# Patient Record
Sex: Male | Born: 1950
Health system: Southern US, Community
[De-identification: ages and names within clinical notes are randomized; demographics above are authoritative.]

## PROBLEM LIST (undated history)

## (undated) DIAGNOSIS — I6529 Occlusion and stenosis of unspecified carotid artery: Secondary | ICD-10-CM

## (undated) DIAGNOSIS — E119 Type 2 diabetes mellitus without complications: Secondary | ICD-10-CM

## (undated) DIAGNOSIS — Z789 Other specified health status: Secondary | ICD-10-CM

## (undated) DIAGNOSIS — H3320 Serous retinal detachment, unspecified eye: Secondary | ICD-10-CM

## (undated) DIAGNOSIS — L409 Psoriasis, unspecified: Secondary | ICD-10-CM

## (undated) HISTORY — PX: RETINAL DETACHMENT REPAIR W/ SCLERAL BUCKLE LE: SHX2338

## (undated) HISTORY — DX: Type 2 diabetes mellitus without complications: E11.9

## (undated) HISTORY — DX: Psoriasis, unspecified: L40.9

## (undated) HISTORY — DX: Serous retinal detachment, unspecified eye: H33.20

## (undated) HISTORY — DX: Other specified health status: Z78.9

## (undated) HISTORY — DX: Occlusion and stenosis of unspecified carotid artery: I65.29

---

## 2003-09-12 HISTORY — PX: CAROTID ENDARTERECTOMY: SUR193

## 2005-04-21 ENCOUNTER — Encounter: Admission: RE | Admit: 2005-04-21 | Discharge: 2005-04-21 | Payer: Self-pay | Admitting: *Deleted

## 2005-05-02 ENCOUNTER — Inpatient Hospital Stay (HOSPITAL_COMMUNITY): Admission: RE | Admit: 2005-05-02 | Discharge: 2005-05-03 | Payer: Self-pay | Admitting: *Deleted

## 2006-04-20 IMAGING — CT CT CHEST W/ CM
1 series · 15 of 31 positions shown, 19 images · IV contrast (omnipaque)
Comparison: 04/28/05

CLINICAL DATA: 53 year-old male with carotid stenosis, status post endarterectomy.  Right lower lobe nodule visible on 04/28/05 chest x-ray.
CHEST CT WITH CONTRAST:
TECHNIQUE: Multidetector CT imaging of the chest was performed following the standard protocol during bolus administration of intravenous contrast.
Contrast:  100 cc Omnipaque 300

[Series 2: routine chest · axial · 0.78mm/px · z∈[-327,-47]mm · 15 of 62 slices shown, 19 images]
[im 3/62  mediastinal]
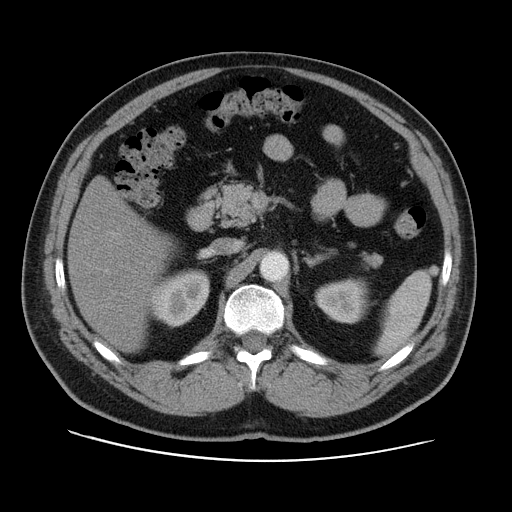
[im 3/62  lung]
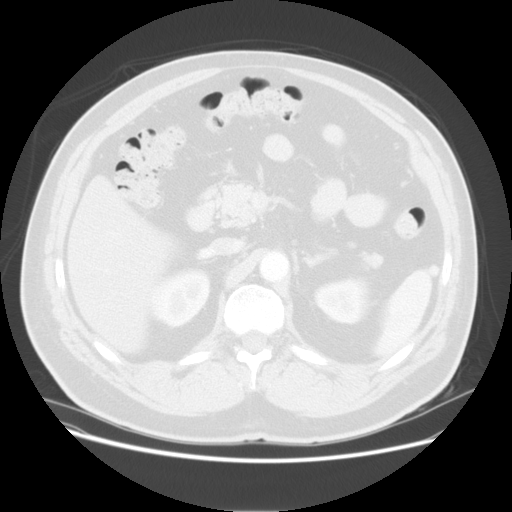
[im 7/62  lung]
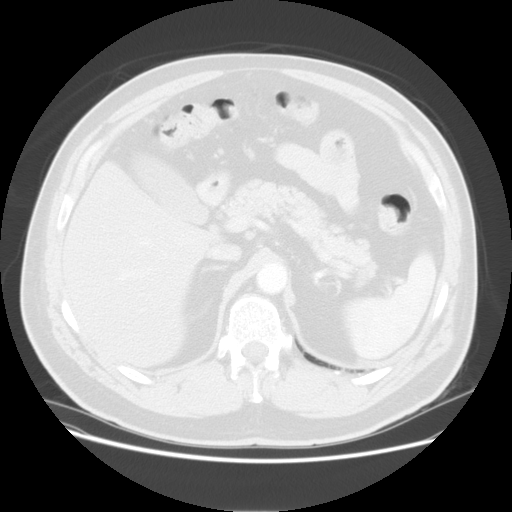
[im 12/62  lung]
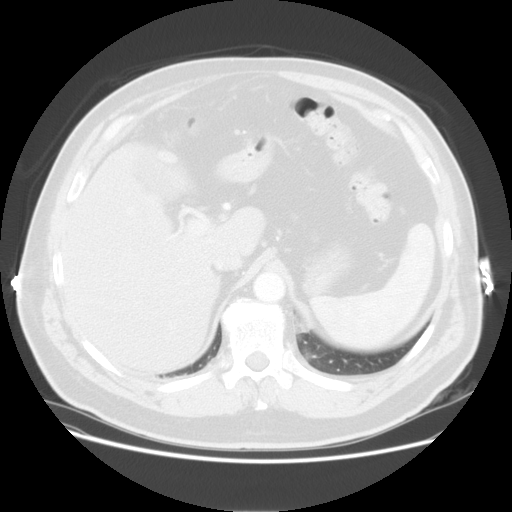
[im 14/62  lung]
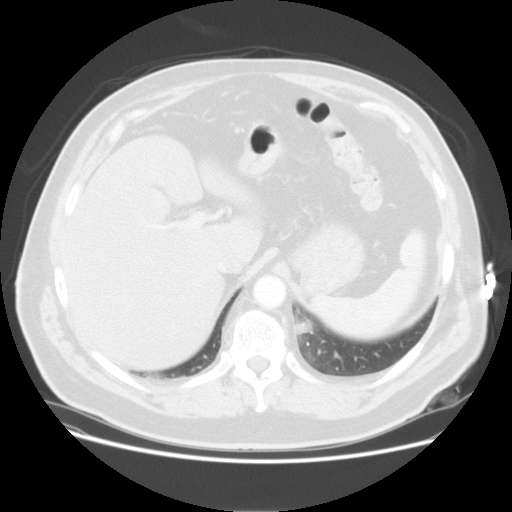
[im 19/62  mediastinal]
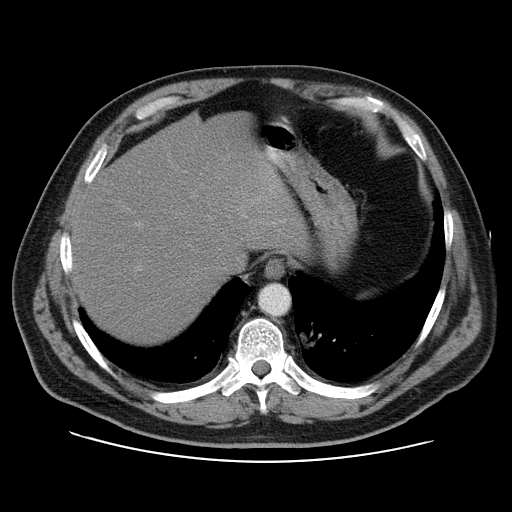
[im 19/62  lung]
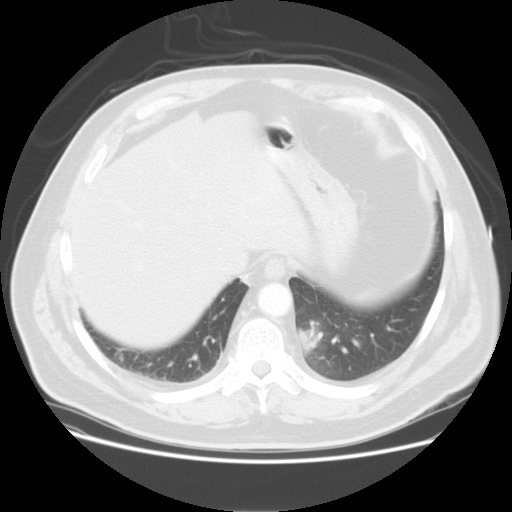
[im 23/62  lung]
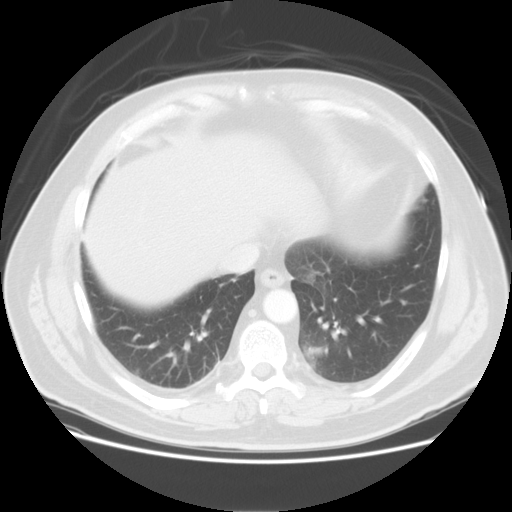
[im 28/62  lung]
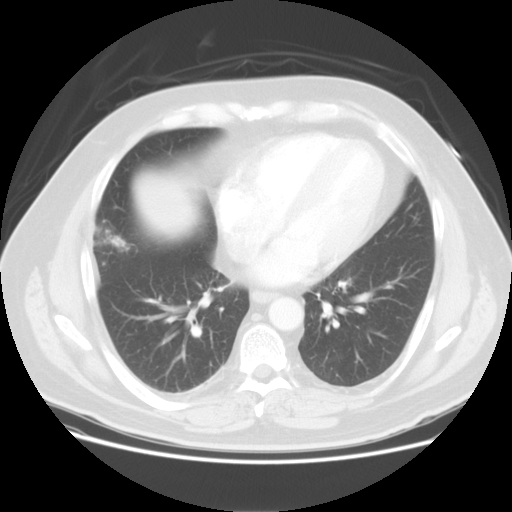
[im 32/62  lung]
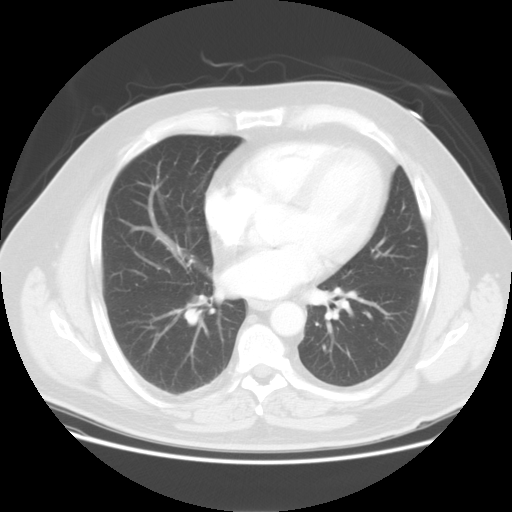
[im 34/62  mediastinal]
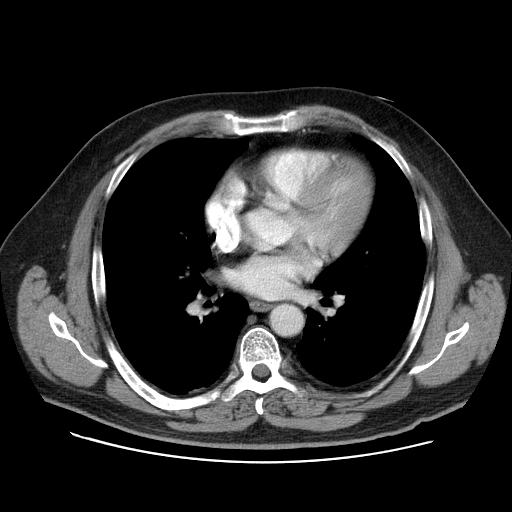
[im 34/62  lung]
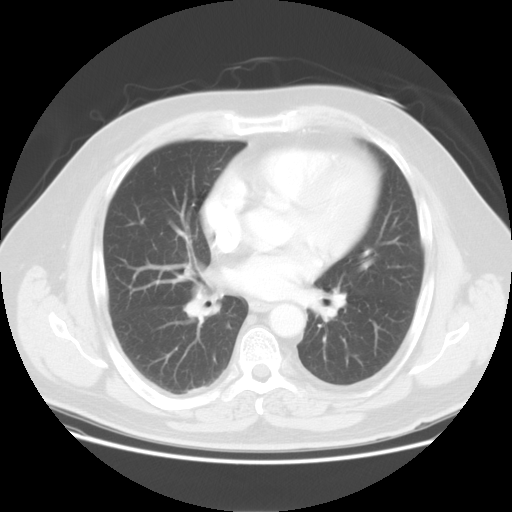
[im 39/62  lung]
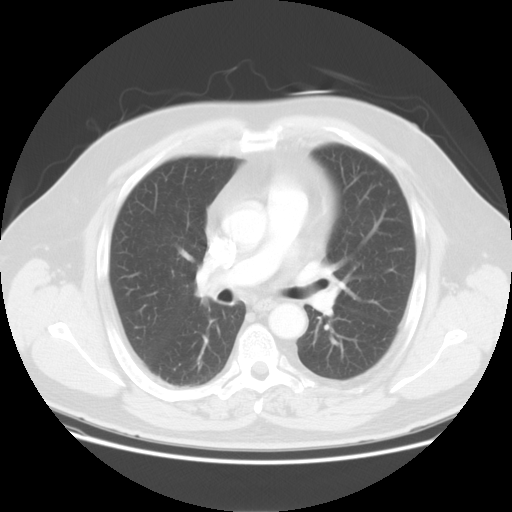
[im 43/62  lung]
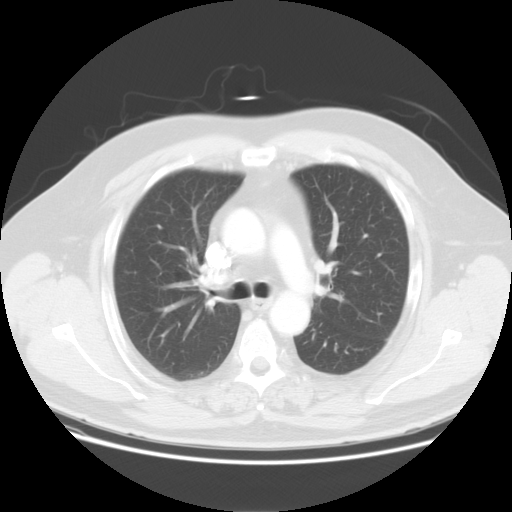
[im 48/62  lung]
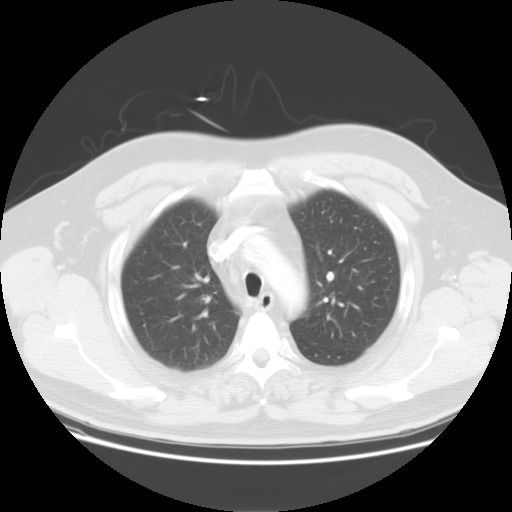
[im 50/62  mediastinal]
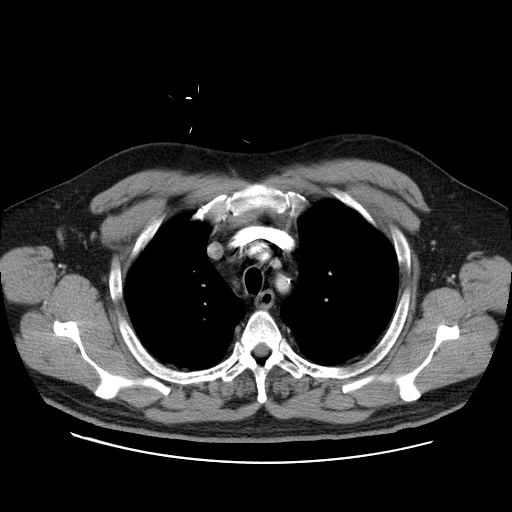
[im 50/62  lung]
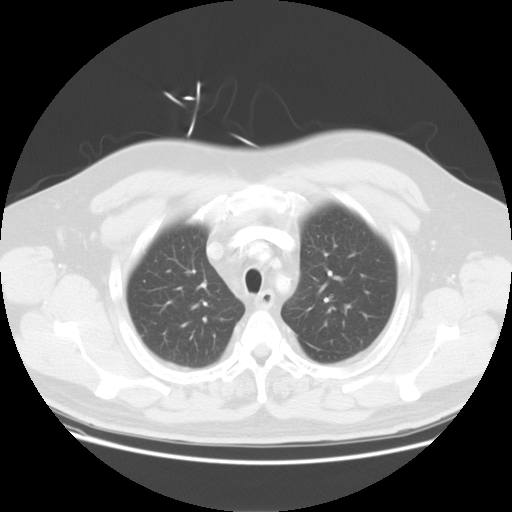
[im 55/62  lung]
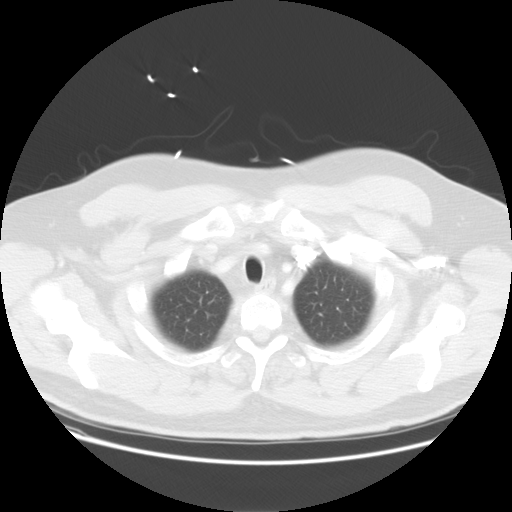
[im 59/62  lung]
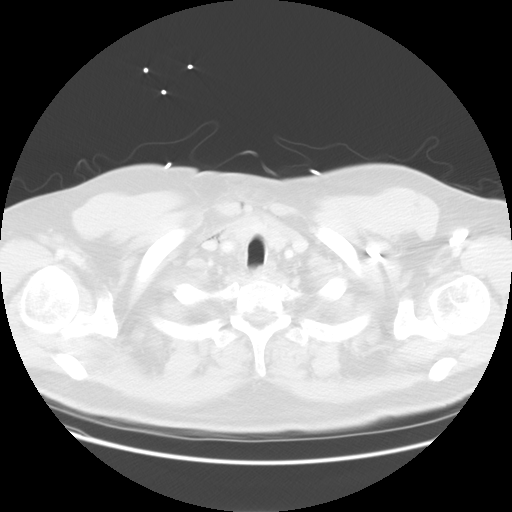

[15 of 31 positions shown; findings below may reference images not displayed]

FINDINGS: Postoperative soft tissue swelling and subcutaneous air is present in the right supraclavicular region overlying the right internal jugular vein and carotid artery.  No axillary, mediastinal, or hilar adenopathy.  Normal heart size.  No pericardial or pleural fluid.  No hiatal hernia.  
Lung windows demonstrate a small triangulated area of focal airspace disease subpleural in location involving the inferolateral aspect of the right middle lobe lateral segment accounting for the nodular density by chest radiograph.  No significant spiculation.  The finding is nonspecific but suggestive of a small focus of infectious or inflammatory process.  Radiographic followup is recommended after treatment to document resolution in two to three months.  Minimal atelectatic change is present in the lower lobes medially on both sides.  Central airways are patent.  No bronchial thickening, bronchiectasis, or emphysema.  
Imaging of the upper abdomen demonstrates no acute or additional finding.
IMPRESSION: 1.  Inferolateral right middle lobe focal airspace opacity nonspecific in appearance but consistent with an infectious or inflammatory process.  Recommend radiographic followup as described above. 
2.  Bibasilar atelectasis.
3.  No adenopathy.  
4.  Postoperative changes in the right supraclavicular region.

## 2010-10-01 ENCOUNTER — Encounter: Payer: Self-pay | Admitting: *Deleted

## 2011-11-09 ENCOUNTER — Encounter (INDEPENDENT_AMBULATORY_CARE_PROVIDER_SITE_OTHER): Payer: PRIVATE HEALTH INSURANCE | Admitting: Ophthalmology

## 2011-11-09 ENCOUNTER — Ambulatory Visit: Admit: 2011-11-09 | Payer: Self-pay | Admitting: Ophthalmology

## 2011-11-09 DIAGNOSIS — H2510 Age-related nuclear cataract, unspecified eye: Secondary | ICD-10-CM

## 2011-11-09 DIAGNOSIS — H33009 Unspecified retinal detachment with retinal break, unspecified eye: Secondary | ICD-10-CM

## 2011-11-09 DIAGNOSIS — H33309 Unspecified retinal break, unspecified eye: Secondary | ICD-10-CM

## 2011-11-09 DIAGNOSIS — H43819 Vitreous degeneration, unspecified eye: Secondary | ICD-10-CM

## 2011-11-09 SURGERY — SCLERAL BUCKLE
Anesthesia: General | Laterality: Right

## 2013-09-17 ENCOUNTER — Ambulatory Visit (INDEPENDENT_AMBULATORY_CARE_PROVIDER_SITE_OTHER): Payer: Self-pay | Admitting: Family Medicine

## 2013-09-17 ENCOUNTER — Encounter: Payer: Self-pay | Admitting: Family Medicine

## 2013-09-17 VITALS — BP 124/78 | HR 75 | Temp 98.3°F | Ht 68.0 in | Wt 206.0 lb

## 2013-09-17 DIAGNOSIS — H3320 Serous retinal detachment, unspecified eye: Secondary | ICD-10-CM

## 2013-09-17 DIAGNOSIS — L408 Other psoriasis: Secondary | ICD-10-CM

## 2013-09-17 DIAGNOSIS — L409 Psoriasis, unspecified: Secondary | ICD-10-CM

## 2013-09-17 DIAGNOSIS — I6529 Occlusion and stenosis of unspecified carotid artery: Secondary | ICD-10-CM

## 2013-09-17 DIAGNOSIS — E669 Obesity, unspecified: Secondary | ICD-10-CM

## 2013-09-17 HISTORY — DX: Obesity, unspecified: E66.9

## 2013-09-17 NOTE — Progress Notes (Signed)
Pre-visit discussion using our clinic review tool. No additional management support is needed unless otherwise documented below in the visit note.  

## 2013-09-17 NOTE — Progress Notes (Signed)
   Date:  09/17/2013   Name:  Ethan Bowers   DOB:  09/21/1950   MRN:  [id number] Gender: male Age: 63 y.o.  Primary Physician:  Hannah BeatSpencer Kijana Estock, MD   Chief Complaint: Establish Care   Subjective:   History of Present Illness:  Ethan Bowers is a 63 y.o. 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   Still works every day.   Carotid disease, s/p endarterectomy  Psoriasis, stable  taclonex  Ethan Bowers.  Past Medical History, Surgical History, Social History, Family History, Problem List, Medications, and Allergies have been reviewed and updated if relevant.  Review of Systems:  GEN: No acute illnesses, no fevers, chills. GI: No n/v/d, eating normally Pulm: No SOB Interactive and getting along well at home.  Otherwise, ROS is as per the HPI.  Objective:   Physical Examination: BP 124/78  Pulse 75  Temp(Src) 98.3 F (36.8 C) (Oral)  Ht 5\' 8"  (1.727 m)  Wt 206 lb (93.441 kg)  BMI 31.33 kg/m2   GEN: WDWN, NAD, Non-toxic, A & O x 3 HEENT: Atraumatic, Normocephalic. Neck supple. No masses, No LAD. Ears and Nose: No external deformity. CV: RRR, No M/G/R. No JVD. No thrill. No extra heart sounds. PULM: CTA B, no wheezes, crackles, rhonchi. No retractions. No resp. distress. No accessory muscle use. EXTR: No c/c/e NEURO Normal gait.  PSYCH: Normally interactive. Conversant. Not depressed or anxious appearing.  Calm demeanor.   Laboratory and Imaging Data:  Assessment & Plan:    Carotid artery plaque, unspecified laterality  Psoriasis  Retinal detachment, unspecified laterality  Basically no active probs. He will send me his labs and f/u for CPX in a year  Signed,  Karleen HampshireSpencer T. Chayton Murata, MD, CAQ Sports Medicine  [facility] [address] [place] [address] Phone: [phone] Fax: [phone]    Medication List    Notice As of  09/17/2013 11:59 PM   You have not been prescribed any medications.

## 2013-09-18 ENCOUNTER — Encounter: Payer: Self-pay | Admitting: Family Medicine

## 2013-09-18 DIAGNOSIS — L409 Psoriasis, unspecified: Secondary | ICD-10-CM | POA: Insufficient documentation

## 2013-09-18 DIAGNOSIS — H3320 Serous retinal detachment, unspecified eye: Secondary | ICD-10-CM | POA: Insufficient documentation

## 2013-09-18 DIAGNOSIS — I6529 Occlusion and stenosis of unspecified carotid artery: Secondary | ICD-10-CM | POA: Insufficient documentation

## 2015-01-04 DIAGNOSIS — H2512 Age-related nuclear cataract, left eye: Secondary | ICD-10-CM | POA: Diagnosis not present

## 2015-01-04 DIAGNOSIS — H35051 Retinal neovascularization, unspecified, right eye: Secondary | ICD-10-CM | POA: Diagnosis not present

## 2015-01-04 DIAGNOSIS — Z961 Presence of intraocular lens: Secondary | ICD-10-CM | POA: Diagnosis not present

## 2015-03-19 DIAGNOSIS — I10 Essential (primary) hypertension: Secondary | ICD-10-CM | POA: Diagnosis not present

## 2015-04-14 DIAGNOSIS — I480 Paroxysmal atrial fibrillation: Secondary | ICD-10-CM | POA: Diagnosis not present

## 2015-04-14 DIAGNOSIS — I10 Essential (primary) hypertension: Secondary | ICD-10-CM | POA: Diagnosis not present

## 2015-04-14 DIAGNOSIS — E780 Pure hypercholesterolemia: Secondary | ICD-10-CM | POA: Diagnosis not present

## 2015-04-14 DIAGNOSIS — E1165 Type 2 diabetes mellitus with hyperglycemia: Secondary | ICD-10-CM | POA: Diagnosis not present

## 2015-05-18 DIAGNOSIS — I480 Paroxysmal atrial fibrillation: Secondary | ICD-10-CM | POA: Diagnosis not present

## 2015-05-18 DIAGNOSIS — E780 Pure hypercholesterolemia: Secondary | ICD-10-CM | POA: Diagnosis not present

## 2015-05-18 DIAGNOSIS — Z79899 Other long term (current) drug therapy: Secondary | ICD-10-CM | POA: Diagnosis not present

## 2015-05-18 DIAGNOSIS — E1165 Type 2 diabetes mellitus with hyperglycemia: Secondary | ICD-10-CM | POA: Diagnosis not present

## 2015-05-18 DIAGNOSIS — I10 Essential (primary) hypertension: Secondary | ICD-10-CM | POA: Diagnosis not present

## 2015-08-03 DIAGNOSIS — I10 Essential (primary) hypertension: Secondary | ICD-10-CM | POA: Diagnosis not present

## 2015-08-03 DIAGNOSIS — E7800 Pure hypercholesterolemia, unspecified: Secondary | ICD-10-CM | POA: Diagnosis not present

## 2015-08-03 DIAGNOSIS — E1165 Type 2 diabetes mellitus with hyperglycemia: Secondary | ICD-10-CM | POA: Diagnosis not present

## 2016-02-08 DIAGNOSIS — I10 Essential (primary) hypertension: Secondary | ICD-10-CM | POA: Diagnosis not present

## 2016-02-08 DIAGNOSIS — Z0000 Encounter for general adult medical examination without abnormal findings: Secondary | ICD-10-CM | POA: Diagnosis not present

## 2016-02-08 DIAGNOSIS — E785 Hyperlipidemia, unspecified: Secondary | ICD-10-CM | POA: Diagnosis not present

## 2016-02-08 DIAGNOSIS — I480 Paroxysmal atrial fibrillation: Secondary | ICD-10-CM | POA: Diagnosis not present

## 2016-02-08 DIAGNOSIS — E1165 Type 2 diabetes mellitus with hyperglycemia: Secondary | ICD-10-CM | POA: Diagnosis not present

## 2016-02-08 DIAGNOSIS — Z79899 Other long term (current) drug therapy: Secondary | ICD-10-CM | POA: Diagnosis not present

## 2016-02-08 DIAGNOSIS — H6123 Impacted cerumen, bilateral: Secondary | ICD-10-CM | POA: Diagnosis not present

## 2016-02-16 DIAGNOSIS — Z1212 Encounter for screening for malignant neoplasm of rectum: Secondary | ICD-10-CM | POA: Diagnosis not present

## 2016-02-16 DIAGNOSIS — Z1211 Encounter for screening for malignant neoplasm of colon: Secondary | ICD-10-CM | POA: Diagnosis not present

## 2016-03-01 DIAGNOSIS — E1165 Type 2 diabetes mellitus with hyperglycemia: Secondary | ICD-10-CM | POA: Diagnosis not present

## 2016-03-01 DIAGNOSIS — I10 Essential (primary) hypertension: Secondary | ICD-10-CM | POA: Diagnosis not present

## 2016-03-01 DIAGNOSIS — I480 Paroxysmal atrial fibrillation: Secondary | ICD-10-CM | POA: Diagnosis not present

## 2016-03-01 DIAGNOSIS — E7800 Pure hypercholesterolemia, unspecified: Secondary | ICD-10-CM | POA: Diagnosis not present

## 2016-05-01 DIAGNOSIS — E1165 Type 2 diabetes mellitus with hyperglycemia: Secondary | ICD-10-CM | POA: Diagnosis not present

## 2016-05-01 DIAGNOSIS — E785 Hyperlipidemia, unspecified: Secondary | ICD-10-CM | POA: Diagnosis not present

## 2016-05-01 DIAGNOSIS — I480 Paroxysmal atrial fibrillation: Secondary | ICD-10-CM | POA: Diagnosis not present

## 2016-05-01 DIAGNOSIS — Z79899 Other long term (current) drug therapy: Secondary | ICD-10-CM | POA: Diagnosis not present

## 2016-05-01 DIAGNOSIS — I10 Essential (primary) hypertension: Secondary | ICD-10-CM | POA: Diagnosis not present

## 2016-07-17 DIAGNOSIS — E119 Type 2 diabetes mellitus without complications: Secondary | ICD-10-CM | POA: Diagnosis not present

## 2016-07-17 DIAGNOSIS — H40033 Anatomical narrow angle, bilateral: Secondary | ICD-10-CM | POA: Diagnosis not present

## 2016-08-31 DIAGNOSIS — E1165 Type 2 diabetes mellitus with hyperglycemia: Secondary | ICD-10-CM | POA: Diagnosis not present

## 2016-08-31 DIAGNOSIS — I480 Paroxysmal atrial fibrillation: Secondary | ICD-10-CM | POA: Diagnosis not present

## 2016-08-31 DIAGNOSIS — E7800 Pure hypercholesterolemia, unspecified: Secondary | ICD-10-CM | POA: Diagnosis not present

## 2016-08-31 DIAGNOSIS — I10 Essential (primary) hypertension: Secondary | ICD-10-CM | POA: Diagnosis not present

## 2017-02-15 DIAGNOSIS — I10 Essential (primary) hypertension: Secondary | ICD-10-CM | POA: Diagnosis not present

## 2017-02-15 DIAGNOSIS — Z79899 Other long term (current) drug therapy: Secondary | ICD-10-CM | POA: Diagnosis not present

## 2017-02-15 DIAGNOSIS — Z0000 Encounter for general adult medical examination without abnormal findings: Secondary | ICD-10-CM | POA: Diagnosis not present

## 2017-02-15 DIAGNOSIS — E1165 Type 2 diabetes mellitus with hyperglycemia: Secondary | ICD-10-CM | POA: Diagnosis not present

## 2017-02-15 DIAGNOSIS — Z1159 Encounter for screening for other viral diseases: Secondary | ICD-10-CM | POA: Diagnosis not present

## 2017-02-15 DIAGNOSIS — E785 Hyperlipidemia, unspecified: Secondary | ICD-10-CM | POA: Diagnosis not present

## 2017-02-15 DIAGNOSIS — N529 Male erectile dysfunction, unspecified: Secondary | ICD-10-CM | POA: Diagnosis not present

## 2017-07-30 DIAGNOSIS — Z79899 Other long term (current) drug therapy: Secondary | ICD-10-CM | POA: Diagnosis not present

## 2017-07-30 DIAGNOSIS — E785 Hyperlipidemia, unspecified: Secondary | ICD-10-CM | POA: Diagnosis not present

## 2017-07-30 DIAGNOSIS — H6123 Impacted cerumen, bilateral: Secondary | ICD-10-CM | POA: Diagnosis not present

## 2017-07-30 DIAGNOSIS — I480 Paroxysmal atrial fibrillation: Secondary | ICD-10-CM | POA: Diagnosis not present

## 2017-07-30 DIAGNOSIS — E1169 Type 2 diabetes mellitus with other specified complication: Secondary | ICD-10-CM | POA: Diagnosis not present

## 2017-07-30 DIAGNOSIS — I10 Essential (primary) hypertension: Secondary | ICD-10-CM | POA: Diagnosis not present

## 2017-10-17 DIAGNOSIS — I480 Paroxysmal atrial fibrillation: Secondary | ICD-10-CM | POA: Diagnosis not present

## 2017-10-17 DIAGNOSIS — I10 Essential (primary) hypertension: Secondary | ICD-10-CM | POA: Diagnosis not present

## 2017-10-17 DIAGNOSIS — E785 Hyperlipidemia, unspecified: Secondary | ICD-10-CM | POA: Diagnosis not present

## 2017-10-17 DIAGNOSIS — E1169 Type 2 diabetes mellitus with other specified complication: Secondary | ICD-10-CM | POA: Diagnosis not present

## 2017-10-17 DIAGNOSIS — Z79899 Other long term (current) drug therapy: Secondary | ICD-10-CM | POA: Diagnosis not present

## 2017-12-12 DIAGNOSIS — Z79899 Other long term (current) drug therapy: Secondary | ICD-10-CM | POA: Diagnosis not present

## 2017-12-12 DIAGNOSIS — E785 Hyperlipidemia, unspecified: Secondary | ICD-10-CM | POA: Diagnosis not present

## 2018-06-20 DIAGNOSIS — M791 Myalgia, unspecified site: Secondary | ICD-10-CM | POA: Diagnosis not present

## 2018-06-20 DIAGNOSIS — E1169 Type 2 diabetes mellitus with other specified complication: Secondary | ICD-10-CM | POA: Diagnosis not present

## 2018-06-20 DIAGNOSIS — T466X5A Adverse effect of antihyperlipidemic and antiarteriosclerotic drugs, initial encounter: Secondary | ICD-10-CM | POA: Diagnosis not present

## 2018-06-20 DIAGNOSIS — E785 Hyperlipidemia, unspecified: Secondary | ICD-10-CM | POA: Diagnosis not present

## 2018-09-10 DIAGNOSIS — H40033 Anatomical narrow angle, bilateral: Secondary | ICD-10-CM | POA: Diagnosis not present

## 2018-09-10 DIAGNOSIS — E119 Type 2 diabetes mellitus without complications: Secondary | ICD-10-CM | POA: Diagnosis not present

## 2018-11-28 DIAGNOSIS — Z0000 Encounter for general adult medical examination without abnormal findings: Secondary | ICD-10-CM | POA: Diagnosis not present

## 2018-11-28 DIAGNOSIS — M791 Myalgia, unspecified site: Secondary | ICD-10-CM | POA: Diagnosis not present

## 2018-11-28 DIAGNOSIS — T466X5A Adverse effect of antihyperlipidemic and antiarteriosclerotic drugs, initial encounter: Secondary | ICD-10-CM | POA: Diagnosis not present

## 2018-11-28 DIAGNOSIS — E785 Hyperlipidemia, unspecified: Secondary | ICD-10-CM | POA: Diagnosis not present

## 2018-11-28 DIAGNOSIS — E1169 Type 2 diabetes mellitus with other specified complication: Secondary | ICD-10-CM | POA: Diagnosis not present

## 2019-02-12 DIAGNOSIS — H40033 Anatomical narrow angle, bilateral: Secondary | ICD-10-CM | POA: Diagnosis not present

## 2019-02-12 DIAGNOSIS — E119 Type 2 diabetes mellitus without complications: Secondary | ICD-10-CM | POA: Diagnosis not present

## 2019-04-10 ENCOUNTER — Other Ambulatory Visit: Payer: Self-pay

## 2019-07-22 DIAGNOSIS — E7800 Pure hypercholesterolemia, unspecified: Secondary | ICD-10-CM | POA: Diagnosis not present

## 2019-07-22 DIAGNOSIS — E1169 Type 2 diabetes mellitus with other specified complication: Secondary | ICD-10-CM | POA: Diagnosis not present

## 2019-07-22 DIAGNOSIS — E785 Hyperlipidemia, unspecified: Secondary | ICD-10-CM | POA: Diagnosis not present

## 2019-07-22 DIAGNOSIS — Z79899 Other long term (current) drug therapy: Secondary | ICD-10-CM | POA: Diagnosis not present

## 2019-07-22 DIAGNOSIS — Z125 Encounter for screening for malignant neoplasm of prostate: Secondary | ICD-10-CM | POA: Diagnosis not present

## 2019-07-22 DIAGNOSIS — I480 Paroxysmal atrial fibrillation: Secondary | ICD-10-CM | POA: Diagnosis not present

## 2019-07-27 DIAGNOSIS — Z1211 Encounter for screening for malignant neoplasm of colon: Secondary | ICD-10-CM | POA: Diagnosis not present

## 2019-07-27 DIAGNOSIS — Z1212 Encounter for screening for malignant neoplasm of rectum: Secondary | ICD-10-CM | POA: Diagnosis not present

## 2019-12-10 DIAGNOSIS — I10 Essential (primary) hypertension: Secondary | ICD-10-CM | POA: Diagnosis not present

## 2019-12-10 DIAGNOSIS — E1169 Type 2 diabetes mellitus with other specified complication: Secondary | ICD-10-CM | POA: Diagnosis not present

## 2020-01-09 DIAGNOSIS — I480 Paroxysmal atrial fibrillation: Secondary | ICD-10-CM | POA: Diagnosis not present

## 2020-01-09 DIAGNOSIS — E785 Hyperlipidemia, unspecified: Secondary | ICD-10-CM | POA: Diagnosis not present

## 2020-01-09 DIAGNOSIS — E1169 Type 2 diabetes mellitus with other specified complication: Secondary | ICD-10-CM | POA: Diagnosis not present

## 2020-03-10 DIAGNOSIS — I480 Paroxysmal atrial fibrillation: Secondary | ICD-10-CM | POA: Diagnosis not present

## 2020-03-10 DIAGNOSIS — E1169 Type 2 diabetes mellitus with other specified complication: Secondary | ICD-10-CM | POA: Diagnosis not present

## 2020-04-10 DIAGNOSIS — E1169 Type 2 diabetes mellitus with other specified complication: Secondary | ICD-10-CM | POA: Diagnosis not present

## 2020-04-10 DIAGNOSIS — I480 Paroxysmal atrial fibrillation: Secondary | ICD-10-CM | POA: Diagnosis not present

## 2020-04-18 ENCOUNTER — Emergency Department (HOSPITAL_COMMUNITY)
Admission: EM | Admit: 2020-04-18 | Discharge: 2020-04-18 | Disposition: A | Discharge status: Home / Self Care | Payer: Medicare Other | Attending: Emergency Medicine | Admitting: Emergency Medicine

## 2020-04-18 ENCOUNTER — Emergency Department (HOSPITAL_COMMUNITY): Payer: Medicare Other

## 2020-04-18 ENCOUNTER — Other Ambulatory Visit: Payer: Self-pay

## 2020-04-18 ENCOUNTER — Encounter (HOSPITAL_COMMUNITY): Payer: Self-pay

## 2020-04-18 DIAGNOSIS — R410 Disorientation, unspecified: Secondary | ICD-10-CM | POA: Insufficient documentation

## 2020-04-18 DIAGNOSIS — Z5321 Procedure and treatment not carried out due to patient leaving prior to being seen by health care provider: Secondary | ICD-10-CM | POA: Diagnosis not present

## 2020-04-18 DIAGNOSIS — I4891 Unspecified atrial fibrillation: Secondary | ICD-10-CM | POA: Insufficient documentation

## 2020-04-18 DIAGNOSIS — I4510 Unspecified right bundle-branch block: Secondary | ICD-10-CM | POA: Diagnosis not present

## 2020-04-18 DIAGNOSIS — I447 Left bundle-branch block, unspecified: Secondary | ICD-10-CM | POA: Diagnosis not present

## 2020-04-18 DIAGNOSIS — I10 Essential (primary) hypertension: Secondary | ICD-10-CM | POA: Diagnosis not present

## 2020-04-18 DIAGNOSIS — R531 Weakness: Secondary | ICD-10-CM | POA: Diagnosis not present

## 2020-04-18 DIAGNOSIS — R0602 Shortness of breath: Secondary | ICD-10-CM | POA: Diagnosis not present

## 2020-04-18 LAB — COMPREHENSIVE METABOLIC PANEL
ALT: 22 U/L (ref 0–44)
AST: 22 U/L (ref 15–41)
Albumin: 4 g/dL (ref 3.5–5.0)
Alkaline Phosphatase: 55 U/L (ref 38–126)
Anion gap: 10 (ref 5–15)
BUN: 18 mg/dL (ref 8–23)
CO2: 23 mmol/L (ref 22–32)
Calcium: 9.4 mg/dL (ref 8.9–10.3)
Chloride: 101 mmol/L (ref 98–111)
Creatinine, Ser: 1.32 mg/dL — ABNORMAL HIGH (ref 0.61–1.24)
GFR calc Af Amer: >60 mL/min (ref >60)
GFR calc non Af Amer: 54 mL/min — ABNORMAL LOW (ref >60)
Glucose, Bld: 167 mg/dL — ABNORMAL HIGH (ref 70–99)
Potassium: 4.9 mmol/L (ref 3.5–5.1)
Sodium: 134 mmol/L — ABNORMAL LOW (ref 135–145)
Total Bilirubin: 0.7 mg/dL (ref 0.3–1.2)
Total Protein: 7.9 g/dL (ref 6.5–8.1)

## 2020-04-18 LAB — CBC
HCT: 42.8 % (ref 39.0–52.0)
Hemoglobin: 14.5 g/dL (ref 13.0–17.0)
MCH: 31 pg (ref 26.0–34.0)
MCHC: 33.9 g/dL (ref 30.0–36.0)
MCV: 91.5 fL (ref 80.0–100.0)
Platelets: 302 10*3/uL (ref 150–400)
RBC: 4.68 MIL/uL (ref 4.22–5.81)
RDW: 13 % (ref 11.5–15.5)
WBC: 11.7 10*3/uL — ABNORMAL HIGH (ref 4.0–10.5)
nRBC: 0 % (ref 0.0–0.2)

## 2020-04-18 LAB — DIFFERENTIAL
Abs Immature Granulocytes: 0.05 10*3/uL (ref 0.00–0.07)
Basophils Absolute: 0 10*3/uL (ref 0.0–0.1)
Basophils Relative: 0 %
Eosinophils Absolute: 0 10*3/uL (ref 0.0–0.5)
Eosinophils Relative: 0 %
Immature Granulocytes: 0 %
Lymphocytes Relative: 11 %
Lymphs Abs: 1.2 10*3/uL (ref 0.7–4.0)
Monocytes Absolute: 0.6 10*3/uL (ref 0.1–1.0)
Monocytes Relative: 5 %
Neutro Abs: 9.7 10*3/uL — ABNORMAL HIGH (ref 1.7–7.7)
Neutrophils Relative %: 84 %

## 2020-04-18 LAB — I-STAT CHEM 8, ED
BUN: 20 mg/dL (ref 8–23)
Calcium, Ion: 1.07 mmol/L — ABNORMAL LOW (ref 1.15–1.40)
Chloride: 100 mmol/L (ref 98–111)
Creatinine, Ser: 1.2 mg/dL (ref 0.61–1.24)
Glucose, Bld: 165 mg/dL — ABNORMAL HIGH (ref 70–99)
HCT: 44 % (ref 39.0–52.0)
Hemoglobin: 15 g/dL (ref 13.0–17.0)
Potassium: 4.9 mmol/L (ref 3.5–5.1)
Sodium: 136 mmol/L (ref 135–145)
TCO2: 23 mmol/L (ref 22–32)

## 2020-04-18 LAB — CBG MONITORING, ED: Glucose-Capillary: 150 mg/dL — ABNORMAL HIGH (ref 70–99)

## 2020-04-18 LAB — PROTIME-INR
INR: 1 (ref 0.8–1.2)
Prothrombin Time: 12.9 seconds (ref 11.4–15.2)

## 2020-04-18 LAB — APTT: aPTT: 28 seconds (ref 24–36)

## 2020-04-18 MED ORDER — SODIUM CHLORIDE 0.9% FLUSH: 3.0000 mL | Freq: Once | INTRAVENOUS | Status: DC

## 2020-04-18 NOTE — ED Notes (Signed)
Pt did not want to wait said he was going to see his PCP in the morning.

## 2020-04-18 NOTE — ED Triage Notes (Signed)
Pt presents to ED from home with confusion and trouble finding words, weakness. LKW yesterday. Hx afib. Pt denies CP, SOB, headache.   18G RAC, 18G LAC BP 187/110 HR 96 SpO2 98% RA CBG 213

## 2020-04-19 DIAGNOSIS — Z79899 Other long term (current) drug therapy: Secondary | ICD-10-CM | POA: Diagnosis not present

## 2020-04-19 DIAGNOSIS — E1169 Type 2 diabetes mellitus with other specified complication: Secondary | ICD-10-CM | POA: Diagnosis not present

## 2020-04-19 DIAGNOSIS — I4891 Unspecified atrial fibrillation: Secondary | ICD-10-CM | POA: Diagnosis not present

## 2020-04-19 DIAGNOSIS — E7800 Pure hypercholesterolemia, unspecified: Secondary | ICD-10-CM | POA: Diagnosis not present

## 2020-05-10 NOTE — Progress Notes (Signed)
Cardiology Office Note:    Date:  05/11/2020   ID:  Ethan Bowers, Ethan Bowers January 08, 1951, MRN [id number]  PCP:  Street, Stephanie Coup, MD  Cardiologist:  Norman Herrlich, MD   Referring MD: [address], Stephanie Coup, *  ASSESSMENT:    1. Persistent atrial fibrillation (HCC)   2. Right bundle branch block   3. Type 2 diabetes mellitus without complication, without long-term current use of insulin (HCC)   4. Chronic anticoagulation   5. Dyslipidemia   6. Statin intolerance   7. Essential hypertension    PLAN:    In order of problems listed above:  1. Presented to the hospital with newly recognized atrial fibrillation and TIA.  This could be cardiogenic cardioembolic or related to his known right carotid stenosis and undergo carotid duplex echocardiogram continue anticoagulant reduce beta-blocker with slow rate follow-up in 2 weeks regarding cardioversion. 2. Well-controlled diabetes managed by his PCP 3. Statin intolerant will address this later in medical care 4. BP at target reduce the dose with relative bradycardia     Medication Adjustments/Labs and Tests Ordered: Current medicines are reviewed at length with the patient today.  Concerns regarding medicines are outlined above.  Orders Placed This Encounter  Procedures  . EKG 12-Lead  . ECHOCARDIOGRAM COMPLETE  . VAS US CAROTID   Meds ordered this encounter  Medications  . apixaban (ELIQUIS) 5 MG TABS tablet    Sig: Take 1 tablet (5 mg total) by mouth 2 (two) times daily.    Dispense:  60 tablet    Refill:  3     Chief Complaint  Patient presents with  . Atrial Fibrillation    With TIA  2 weeks prior to cardioversion follow-up  History of Present Illness:    Ethan Bowers is a 69 y.o. male who is being seen today for the evaluation of atrial fibrillation at the request of [address] Stephanie Coup, *.  He [facility] to [facility]/18/2020 was found to be in rate controlled atrial flutter atypical with right  bundle branch block and left the hospital because of weight and was not seen. The ED note at triage said that he was confused difficulty finding words with his speech and had diffuse weakness. His blood pressure was quite elevated 187/110. Labs were performed showing a random glucose of 165 creatinine normal 1.20 potassium 4.9 white count mildly elevated 11,700 hemoglobin normal. CT of the head without contrast showed no acute abnormality no findings of stroke.  His stroke risk is significant with a chads 2 score of 4.  He was seen in follow-up by his primary care physician to 04/28/2020 and was initiated on anticoagulation with Eliquis and aspirin was discontinued.  He continued his beta-blocker and antihypertensive therapy.  He is fairly certain the onset of his atrial fibrillation is the day off his ED visit.  His rate was rapid and was given?  Cardizem by EMS I will request the records.  Since he is coming home he has had no recurrent neurologic event he has been anticoagulant without bleeding complication and home heart rate runs in the range of 55 to 60 bpm although at times is less than 50.  He has mild exertional shortness of breath pulling the trash can out up the hill which is new no edema orthopnea chest pain or syncope.  No history of congenital rheumatic heart disease laboratory studies with his primary care physician include a normal TSH lipid profile 07/22/2019 cholesterol 210 HDL 38 LDL  155 triglycerides 85 A1c 6.2%.  He had a retinal embolism and a right carotid needle endarterectomy 15 years ago and last saw by vascular surgery maybe 10 years ago.  Prior to cardioversion of a repeat carotid duplex performed with his TIA along with echocardiogram and see back in the office 2 weeks.  With his low heart rate I reduce his beta-blocker dose by 50%.  He has not missed any doses of his anticoagulant.  Past Medical History:  Diagnosis Date  . Carotid artery plaque    s/p endarterectomy  .  Psoriasis   . Retinal detachment   . Statin intolerance   . Type 2 diabetes mellitus (HCC)     Past Surgical History:  Procedure Laterality Date  . CAROTID ENDARTERECTOMY  2005  . RETINAL DETACHMENT REPAIR W/ SCLERAL BUCKLE LE     Detached Retina    Current Medications: Current Meds  Medication Sig  . apixaban (ELIQUIS) 5 MG TABS tablet Take 1 tablet (5 mg total) by mouth 2 (two) times daily.  . bisoprolol-hydrochlorothiazide (ZIAC) 10-6.25 MG tablet Take 0.5 tablets by mouth daily.  . Coenzyme Q10 (CO Q 10 PO) Take 1 tablet by mouth daily.  . Cyanocobalamin (B-12) 1000 MCG SUBL Place 1 tablet under the tongue daily.  . Garlic 1000 MG CAPS Take 1 capsule by mouth daily.  Marland Kitchen glipiZIDE (GLUCOTROL XL) 2.5 MG 24 hr tablet Take 2.5 mg by mouth daily.  . metFORMIN (GLUCOPHAGE-XR) 500 MG 24 hr tablet Take 1,000 mg by mouth 2 (two) times daily.  . Multiple Vitamins-Minerals (ICAPS AREDS 2 PO) Take 1 capsule by mouth daily.  . Omega-3 1000 MG CAPS Take 1,000 mg by mouth at bedtime.  . sildenafil (REVATIO) 20 MG tablet Take 20 mg by mouth daily as needed.  . Turmeric (QC TUMERIC COMPLEX PO) Take 1 capsule by mouth daily.  Marland Kitchen VITAMIN D PO Take 5,000 mg by mouth. Take 2 capsules daily  . [DISCONTINUED] apixaban (ELIQUIS) 5 MG TABS tablet Take 5 mg by mouth 2 (two) times daily.     Allergies:   Patient has no known allergies.   Social History   Socioeconomic History  . Marital status: Single    Spouse name: Not on file  . Number of children: Not on file  . Years of education: Not on file  . Highest education level: Not on file  Occupational History  . Not on file  Tobacco Use  . Smoking status: Former Games developer  . Smokeless tobacco: Never Used  Substance and Sexual Activity  . Alcohol use: Yes  . Drug use: No  . Sexual activity: Not on file  Other Topics Concern  . 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 Difficulty of Paying Living Expenses: Not on file  Food Insecurity:   . Worried About Programme researcher, broadcasting/film/video in the Last Year: Not on file  . Ran Out of Food in the Last Year: Not on file  Transportation Needs:   . Lack of Transportation (Medical): Not on file  . Lack of Transportation (Non-Medical): Not on file  Physical Activity:   . Days of Exercise per Week: Not on file  . Minutes of Exercise per Session: Not on file  Stress:   . Feeling of Stress : Not on file  Social Connections:   . Frequency of  Communication with Friends and Family: Not on file  . Frequency of Social Gatherings with Friends and Family: Not on file  . Attends Religious Services: Not on file  . Active Member of Clubs or Organizations: Not on file  . Attends Banker Meetings: Not on file  . Marital Status: Not on file     Family History: The patient's family history includes Arthritis in his mother; Breast cancer in his mother; Diabetes in his maternal grandfather and paternal grandfather; Prostate cancer in his father; Stroke in his mother.  ROS:   Review of Systems  Constitutional: Negative.  HENT: Negative.   Eyes: Negative.   Cardiovascular: Positive for dyspnea on exertion and palpitations.  Respiratory: Positive for shortness of breath.   Endocrine: Negative.   Hematologic/Lymphatic: Negative.   Skin: Negative.   Musculoskeletal: Negative.   Gastrointestinal: Negative.   Genitourinary: Negative.   Neurological: Positive for weakness.  Psychiatric/Behavioral: Negative.   Allergic/Immunologic: Negative.    Please see the history of present illness.     All other systems reviewed and are negative.  EKGs/Labs/Other Studies Reviewed:    The following studies were reviewed today:   EKG:  EKG is  ordered today.  The ekg ordered today is personally reviewed and demonstrates atrial fibrillation right bundle branch block heart rate  51 bpm  Recent Labs: 04/18/2020: ALT 22; BUN 20; Creatinine, Ser 1.20; Hemoglobin 15.0; Platelets 302; Potassium 4.9; Sodium 136  Recent Lipid Panel No results found for: CHOL, TRIG, HDL, CHOLHDL, VLDL, LDLCALC, LDLDIRECT  Physical Exam:    VS:  BP 139/75   Pulse (!) 51   Ht 5\' 8"  (1.727 m)   Wt 191 lb (86.6 kg)   SpO2 96%   BMI 29.04 kg/m     Wt Readings from Last 3 Encounters:  05/11/20 191 lb (86.6 kg)  09/17/13 206 lb (93.4 kg)     GEN:  Well nourished, well developed in no acute distress HEENT: Normal NECK: No JVD; No carotid bruits LYMPHATICS: No lymphadenopathy CARDIAC: S1 variable irregular rhythm  no murmurs, rubs, gallops RESPIRATORY:  Clear to auscultation without rales, wheezing or rhonchi  ABDOMEN: Soft, non-tender, non-distended MUSCULOSKELETAL:  No edema; No deformity  SKIN: Warm and dry NEUROLOGIC:  Alert and oriented x 3 PSYCHIATRIC:  Normal affect     Signed, 11/15/13, MD  05/11/2020 10:48 AM    [facility]

## 2020-05-11 ENCOUNTER — Encounter: Payer: Self-pay | Admitting: Cardiology

## 2020-05-11 ENCOUNTER — Other Ambulatory Visit: Payer: Self-pay

## 2020-05-11 ENCOUNTER — Ambulatory Visit (INDEPENDENT_AMBULATORY_CARE_PROVIDER_SITE_OTHER): Payer: Medicare Other | Admitting: Cardiology

## 2020-05-11 VITALS — BP 139/75 | HR 51 | Ht 68.0 in | Wt 191.0 lb

## 2020-05-11 DIAGNOSIS — E119 Type 2 diabetes mellitus without complications: Secondary | ICD-10-CM | POA: Diagnosis not present

## 2020-05-11 DIAGNOSIS — I10 Essential (primary) hypertension: Secondary | ICD-10-CM | POA: Diagnosis not present

## 2020-05-11 DIAGNOSIS — I4510 Unspecified right bundle-branch block: Secondary | ICD-10-CM

## 2020-05-11 DIAGNOSIS — Z7901 Long term (current) use of anticoagulants: Secondary | ICD-10-CM

## 2020-05-11 DIAGNOSIS — I4819 Other persistent atrial fibrillation: Secondary | ICD-10-CM | POA: Diagnosis not present

## 2020-05-11 DIAGNOSIS — E785 Hyperlipidemia, unspecified: Secondary | ICD-10-CM

## 2020-05-11 DIAGNOSIS — Z789 Other specified health status: Secondary | ICD-10-CM | POA: Diagnosis not present

## 2020-05-11 DIAGNOSIS — I480 Paroxysmal atrial fibrillation: Secondary | ICD-10-CM | POA: Diagnosis not present

## 2020-05-11 DIAGNOSIS — E1169 Type 2 diabetes mellitus with other specified complication: Secondary | ICD-10-CM | POA: Diagnosis not present

## 2020-05-11 MED ORDER — APIXABAN 5 MG PO TABS: 5.0000 mg | ORAL_TABLET | Freq: Two times a day (BID) | ORAL | 3 refills | Status: DC

## 2020-05-11 NOTE — Patient Instructions (Addendum)
Medication Instructions:  Your physician has recommended you make the following change in your medication:  DECREASE: bisoprolol-hydrochlorothiazide to 0.5 tablet daily.  START: Eliquis 5 mg take one tablet by mouth twice daily.  *If you need a refill on your cardiac medications before your next appointment, please call your pharmacy*   Lab Work: None If you have labs (blood work) drawn today and your tests are completely normal, you will receive your results only by: Marland Kitchen MyChart Message (if you have MyChart) OR . A paper copy in the mail If you have any lab test that is abnormal or we need to change your treatment, we will call you to review the results.   Testing/Procedures: Your physician has requested that you have an echocardiogram. Echocardiography is a painless test that uses sound waves to create images of your heart. It provides your doctor with information about the size and shape of your heart and how well your heart's chambers and valves are working. This procedure takes approximately one hour. There are no restrictions for this procedure.  Your physician has requested that you have a carotid duplex. This test is an ultrasound of the carotid arteries in your neck. It looks at blood flow through these arteries that supply the brain with blood. Allow one hour for this exam. There are no restrictions or special instructions.    Follow-Up: At [facility], you and your health needs are our priority.  As part of our continuing mission to provide you with exceptional heart care, we have created designated Provider Care Teams.  These Care Teams include your primary Cardiologist (physician) and Advanced Practice Providers (APPs -  Physician Assistants and Nurse Practitioners) who all work together to provide you with the care you need, when you need it.  We recommend signing up for the patient portal called "MyChart".  Sign up information is provided on this After Visit Summary.  MyChart  is used to connect with patients for Virtual Visits (Telemedicine).  Patients are able to view lab/test results, encounter notes, upcoming appointments, etc.  Non-urgent messages can be sent to your provider as well.   To learn more about what you can do with MyChart, go to [email or web address].    Your next appointment:   2 week(s)  The format for your next appointment:   In Person  Provider:   Norman Herrlich, MD   Other Instructions

## 2020-05-25 ENCOUNTER — Ambulatory Visit: Payer: Medicare Other | Admitting: Cardiology

## 2020-06-08 ENCOUNTER — Ambulatory Visit (INDEPENDENT_AMBULATORY_CARE_PROVIDER_SITE_OTHER): Payer: Medicare Other

## 2020-06-08 ENCOUNTER — Telehealth: Payer: Self-pay | Admitting: *Deleted

## 2020-06-08 ENCOUNTER — Other Ambulatory Visit: Payer: Self-pay

## 2020-06-08 DIAGNOSIS — I4510 Unspecified right bundle-branch block: Secondary | ICD-10-CM

## 2020-06-08 DIAGNOSIS — Z789 Other specified health status: Secondary | ICD-10-CM

## 2020-06-08 DIAGNOSIS — I6523 Occlusion and stenosis of bilateral carotid arteries: Secondary | ICD-10-CM | POA: Diagnosis not present

## 2020-06-08 DIAGNOSIS — E119 Type 2 diabetes mellitus without complications: Secondary | ICD-10-CM

## 2020-06-08 DIAGNOSIS — Z7901 Long term (current) use of anticoagulants: Secondary | ICD-10-CM

## 2020-06-08 DIAGNOSIS — E785 Hyperlipidemia, unspecified: Secondary | ICD-10-CM

## 2020-06-08 DIAGNOSIS — I10 Essential (primary) hypertension: Secondary | ICD-10-CM

## 2020-06-08 DIAGNOSIS — I4819 Other persistent atrial fibrillation: Secondary | ICD-10-CM

## 2020-06-08 NOTE — Progress Notes (Signed)
Carotid duplex exam performed  Jimmy Breyona Swander RDCS, RVT 

## 2020-06-08 NOTE — Telephone Encounter (Signed)
Referral placed [facility] [place]. Patient aware.

## 2020-07-13 DIAGNOSIS — E119 Type 2 diabetes mellitus without complications: Secondary | ICD-10-CM | POA: Insufficient documentation

## 2020-07-13 DIAGNOSIS — Z789 Other specified health status: Secondary | ICD-10-CM | POA: Insufficient documentation

## 2020-07-15 ENCOUNTER — Ambulatory Visit (INDEPENDENT_AMBULATORY_CARE_PROVIDER_SITE_OTHER): Payer: Medicare Other | Admitting: Cardiology

## 2020-07-15 ENCOUNTER — Other Ambulatory Visit: Payer: Self-pay

## 2020-07-15 ENCOUNTER — Encounter: Payer: Self-pay | Admitting: Cardiology

## 2020-07-15 VITALS — BP 176/83 | HR 66 | Ht 68.0 in | Wt 192.0 lb

## 2020-07-15 DIAGNOSIS — E785 Hyperlipidemia, unspecified: Secondary | ICD-10-CM

## 2020-07-15 DIAGNOSIS — I4510 Unspecified right bundle-branch block: Secondary | ICD-10-CM

## 2020-07-15 DIAGNOSIS — I4819 Other persistent atrial fibrillation: Secondary | ICD-10-CM

## 2020-07-15 DIAGNOSIS — I10 Essential (primary) hypertension: Secondary | ICD-10-CM

## 2020-07-15 DIAGNOSIS — Z7901 Long term (current) use of anticoagulants: Secondary | ICD-10-CM | POA: Diagnosis not present

## 2020-07-15 NOTE — Progress Notes (Signed)
Cardiology Office Note:    Date:  07/15/2020   ID:  Ethan Bowers, DOB 03/02/1951, MRN [id number]  PCP:  Street, Stephanie Coup, MD  Cardiologist:  Norman Herrlich, MD    Referring MD: [address] Stephanie Coup, *    ASSESSMENT:    1. Persistent atrial fibrillation (HCC)   2. Chronic anticoagulation   3. Right bundle branch block   4. Dyslipidemia   5. Essential hypertension    PLAN:    In order of problems listed above:  1. After discussion with the patient shared decision making he opts for rate control and anticoagulation and will defer plans for cardioversion.  Continue beta-blocker continue his anticoagulant. 2. Hypertension stable BP at target continue current treatment 3. Diabetes stable continue current treatment managed by his PCP   Next appointment: 6 months   Medication Adjustments/Labs and Tests Ordered: Current medicines are reviewed at length with the patient today.  Concerns regarding medicines are outlined above.  Orders Placed This Encounter  Procedures   ECHOCARDIOGRAM COMPLETE   No orders of the defined types were placed in this encounter.   No chief complaint on file.   History of Present Illness:    Ethan Bowers is a 69 y.o. male with a hx of persistent atrial fibrillation and a history of retinal embolism and previous right carotid endarterectomy 15 years ago last seen 05/11/2020.  In consideration of cardioversion echocardiogram was ordered not yet performed.  Carotid duplex 05/11/2020 showed greater than 50% atherosclerotic plaque in the common carotid artery no evidence of stenosis in the right carotid artery after endarterectomy and occlusion of the left internal carotid artery. He was referred to vascular surgery in [place]. Compliance with diet, lifestyle and medications: Yes  He is somewhat surprised by the results of his carotid duplex prefers not to see vascular surgery and we will plan on doing follow-up in 1 year.  Overall he is doing  better he is to going to the gym exercising he is having no shortness of breath or exercise intolerance chest pain or palpitation.  He is compliant with his anticoagulant without bleeding complication.  He voices concern hesitancy of cardioversion I reviewed with him that our guidelines stress anticoagulation to prevent stroke and then addressing the quality of his life.  Right now he is asymptomatic his rates controlled on a beta-blocker and with persistent atrial fibrillation his likelihood of resuming sinus rhythm is approximately 50% and may well require antiarrhythmic drug or procedures afterwards I think his approach is reasonable he will contact me if he is symptomatic and I will see back in the office in 6 months.  He does need the echocardiogram performed. Past Medical History:  Diagnosis Date   Carotid artery plaque    s/p endarterectomy   Obesity (BMI 30-39.9) 09/17/2013   Psoriasis    Retinal detachment    Statin intolerance    Type 2 diabetes mellitus (HCC)     Past Surgical History:  Procedure Laterality Date   CAROTID ENDARTERECTOMY  2005   RETINAL DETACHMENT REPAIR W/ SCLERAL BUCKLE LE     Detached Retina    Current Medications: Current Meds  Medication Sig   apixaban (ELIQUIS) 5 MG TABS tablet Take 1 tablet (5 mg total) by mouth 2 (two) times daily.   bisoprolol-hydrochlorothiazide (ZIAC) 10-6.25 MG tablet Take 0.5 tablets by mouth daily.   Coenzyme Q10 (CO Q 10 PO) Take 1 tablet by mouth daily.   Cyanocobalamin (B-12) 1000 MCG SUBL Place 1  tablet under the tongue daily.   Garlic 1000 MG CAPS Take 1 capsule by mouth daily.   glipiZIDE (GLUCOTROL XL) 2.5 MG 24 hr tablet Take 2.5 mg by mouth daily.   MAGNESIUM PO Take 1 tablet by mouth daily.   metFORMIN (GLUCOPHAGE-XR) 500 MG 24 hr tablet Take 1,000 mg by mouth 2 (two) times daily.   Multiple Vitamins-Minerals (ICAPS AREDS 2 PO) Take 1 capsule by mouth daily.   Omega-3 1000 MG CAPS Take 1,000 mg by mouth  at bedtime.   Potassium (POTASSIMIN PO) Take 1 tablet by mouth daily.   sildenafil (REVATIO) 20 MG tablet Take 20 mg by mouth daily as needed.   Turmeric (QC TUMERIC COMPLEX PO) Take 1 capsule by mouth daily.   VITAMIN D PO Take 5,000 mg by mouth. Take 2 capsules daily     Allergies:   Patient has no known allergies.   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   Social Determinants of Health   Financial Resource Strain:    Difficulty of Paying Living Expenses: Not on file  Food Insecurity:    Worried About Programme researcher, broadcasting/film/video in the Last Year: Not on file   [employer] of Food in the Last Year: Not on file  Transportation Needs:    Lack of Transportation (Medical): Not on file   Lack of Transportation (Non-Medical): Not on file  Physical Activity:    Days of Exercise per Week: Not on file   Minutes of Exercise per Session: Not on file  Stress:    Feeling of Stress : Not on file  Social Connections:    Frequency of Communication with Friends and Family: Not on file   Frequency of Social Gatherings with Friends and Family: Not on file   Attends Religious Services: Not on file   Active Member of Clubs or Organizations: Not on file   Attends Banker Meetings: Not on file   Marital Status: Not on file     Family History: The patient's family history includes Arthritis in his mother; Breast cancer in his mother; Diabetes in his maternal grandfather and  paternal grandfather; Prostate cancer in his father; Stroke in his mother. ROS:   Please see the history of present illness.    All other systems reviewed and are negative.  EKGs/Labs/Other Studies Reviewed:    The following studies were reviewed today:   Recent Labs: 04/18/2020: ALT 22; BUN 20; Creatinine, Ser 1.20; Hemoglobin 15.0; Platelets 302; Potassium 4.9; Sodium 136  Recent Lipid Panel No results found for: CHOL, TRIG, HDL, CHOLHDL, VLDL, LDLCALC, LDLDIRECT  Physical Exam:    VS:  BP (!) 176/83    Pulse 66    Ht 5\' 8"  (1.727 m)    Wt 192 lb (87.1 kg)    SpO2 98%    BMI 29.19 kg/m     Wt  Readings from Last 3 Encounters:  07/15/20 192 lb (87.1 kg)  05/11/20 191 lb (86.6 kg)  09/17/13 206 lb (93.4 kg)    Repeat blood pressure by me 136/80 GEN:  Well nourished, well developed in no acute distress HEENT: Normal NECK: No JVD; No carotid bruits LYMPHATICS: No lymphadenopathy CARDIAC: Irregular S1 variable , no murmurs, rubs, gallops RESPIRATORY:  Clear to auscultation without rales, wheezing or rhonchi  ABDOMEN: Soft, non-tender, non-distended MUSCULOSKELETAL:  No edema; No deformity  SKIN: Warm and dry NEUROLOGIC:  Alert and oriented x 3 PSYCHIATRIC:  Normal affect    Signed, Norman Herrlich, MD  07/15/2020 8:05 AM    [facility]

## 2020-07-15 NOTE — Patient Instructions (Signed)

## 2020-08-04 ENCOUNTER — Ambulatory Visit (INDEPENDENT_AMBULATORY_CARE_PROVIDER_SITE_OTHER): Payer: Medicare Other | Admitting: Vascular Surgery

## 2020-08-04 ENCOUNTER — Encounter: Payer: Self-pay | Admitting: Vascular Surgery

## 2020-08-04 ENCOUNTER — Other Ambulatory Visit: Payer: Self-pay

## 2020-08-04 VITALS — BP 132/77 | HR 46 | Temp 97.7°F | Resp 18 | Ht 68.0 in | Wt 188.0 lb

## 2020-08-04 DIAGNOSIS — I6523 Occlusion and stenosis of bilateral carotid arteries: Secondary | ICD-10-CM

## 2020-08-04 NOTE — Progress Notes (Addendum)
REASON FOR CONSULT:    Carotid disease.  The consult is requested by Dr. Norman Herrlich.  ASSESSMENT & PLAN:   BILATERAL CAROTID DISEASE: This patient has evidence of a significant recurrent right carotid stenosis and also a left carotid occlusion.  His carotid endarterectomy was in 2005 by Dr. Madilyn Fireman I believe.  Given that the stenosis is in the common carotid artery and not in the internal carotid artery we do not have good velocity criteria to determine severity and thus we can only say the stenosis is greater than 50%.  However the velocities are significantly elevated as described below which would suggest a significant stenosis.  However these may be falsely elevated because of the contralateral occlusion.  For this reason I recommended a CT angiogram of the head and neck.  This will allow [place] to better assess the recurrent carotid stenosis on the right.  If he does have a recurrent carotid stenosis then typically we would favor transcarotid stenting over redo carotid endarterectomy and a CT angiogram of the head and neck will allow [place] to evaluate him for possible transcarotid stenting.  Of note, if he does have a greater than 80% recurrent right carotid stenosis and is felt to be a candidate for transcarotid stenting he would need to be on aspirin, Plavix, and a statin for 30 days.  This could be in addition to the Eliquis and then we could discontinue his Plavix and potentially stop his aspirin or use 81 mg of aspirin daily.  We will schedule his CT angiogram in the near future and I will see him after that to discuss these results.   Waverly Ferrari, MD Office: [phone]   HPI:   Ethan Bowers is a pleasant 69 y.o. male, who had a previous left carotid endarterectomy in 2005 according to the records.  I am trying to locate this op note which I currently do not have.  He was then lost to follow-up.  I believe this was done by Dr. Liliane Bade.  He had a recent episode of expressive  aphasia and was in the emergency department at [facility].  His symptoms lasted very briefly.  He has a history of paroxysmal atrial fibrillation and it was attributed apparently to his atrial fibrillation.  He was started on Eliquis at that time and his aspirin was discontinued.  His work-up included a CT of the head which was unremarkable.  Also carotid duplex scan showed an occluded left internal carotid artery and a distal right common carotid artery stenosis of greater than 50%.  The patient has had no further symptoms.  He denies any history of stroke, TIAs, or amaurosis fugax.  His risk factors for peripheral vascular disease include diabetes and hypertension.  He denies any history of hypercholesterolemia, family history of premature cardiovascular disease, or tobacco use.  Past Medical History:  Diagnosis Date  . Carotid artery plaque    s/p endarterectomy  . Obesity (BMI 30-39.9) 09/17/2013  . Psoriasis   . Retinal detachment   . Statin intolerance   . Type 2 diabetes mellitus (HCC)     Family History  Problem Relation Age of Onset  . Arthritis Mother   . Breast cancer Mother   . Stroke Mother   . Prostate cancer Father   . Diabetes Maternal Grandfather   . Diabetes Paternal Grandfather     SOCIAL HISTORY: Social History   Socioeconomic History  . Marital status: Single    Spouse name: Not on file  .  Number of children: Not on file  . Years of education: Not on file  . Highest education level: Not on file  Occupational History  . Not on file  Tobacco Use  . Smoking status: Former Games developer  . Smokeless tobacco: Never Used  Substance and Sexual Activity  . Alcohol use: Yes  . Drug use: No  . Sexual activity: Not on file  Other Topics Concern  . 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 Difficulty of Paying Living Expenses: Not on file  Food  Insecurity:   . Worried About Programme researcher, broadcasting/film/video in the Last Year: Not on file  . Ran Out of Food in the Last Year: Not on file  Transportation Needs:   . Lack of Transportation (Medical): Not on file  . Lack of Transportation (Non-Medical): Not on file  Physical Activity:   . Days of Exercise per Week: Not on file  . Minutes of Exercise per Session: Not on file  Stress:   . Feeling of Stress : Not on file  Social Connections:   . Frequency of Communication with Friends and Family: Not on file  . Frequency of Social Gatherings with Friends and Family: Not on file  . Attends Religious Services: Not on file  . Active Member of Clubs or Organizations: Not on file  . Attends Banker Meetings: Not on file  . Marital Status: Not on file  Intimate Partner Violence:   . Fear of Current or Ex-Partner: Not on file  . Emotionally Abused: Not on file  . Physically Abused: Not on file  . Sexually Abused: Not on file    No Known Allergies  Current Outpatient Medications  Medication Sig Dispense Refill  . apixaban (ELIQUIS) 5 MG TABS tablet Take 1 tablet (5 mg total) by mouth 2 (two) times daily. 60 tablet 3  . bisoprolol-hydrochlorothiazide (ZIAC) 10-6.25 MG tablet Take 0.5 tablets by mouth daily.    . Coenzyme Q10 (CO Q 10 PO) Take 1 tablet by mouth daily.    . Cyanocobalamin (B-12) 1000 MCG SUBL Place 1 tablet under the tongue daily.    . Garlic 1000 MG CAPS Take 1 capsule by mouth daily.    Marland Kitchen glipiZIDE (GLUCOTROL XL) 2.5 MG 24 hr tablet Take 2.5 mg by mouth daily.    Marland Kitchen MAGNESIUM PO Take 1 tablet by mouth daily.    . metFORMIN (GLUCOPHAGE-XR) 500 MG 24 hr tablet Take 1,000 mg by mouth 2 (two) times daily.    . Multiple Vitamins-Minerals (ICAPS AREDS 2 PO) Take 1 capsule by mouth daily.    . Omega-3 1000 MG CAPS Take 1,000 mg by mouth at bedtime.    . Potassium (POTASSIMIN PO) Take 1 tablet by mouth daily.    . sildenafil (REVATIO) 20 MG tablet Take 20 mg by mouth daily as  needed.    . Turmeric (QC TUMERIC COMPLEX PO) Take 1 capsule by mouth daily.    Marland Kitchen VITAMIN D PO Take 5,000 mg by mouth. Take 2 capsules daily     No current facility-administered medications for this visit.    REVIEW OF SYSTEMS:  [X]  denotes positive finding, [ ]  denotes negative finding Cardiac  Comments:  Chest pain or chest pressure:    Shortness of breath upon exertion:    Short of breath when lying flat:  Irregular heart rhythm: x       Vascular    Pain in calf, thigh, or hip brought on by ambulation:    Pain in feet at night that wakes you up from your sleep:     Blood clot in your veins:    Leg swelling:         Pulmonary    Oxygen at home:    Productive cough:     Wheezing:         Neurologic    Sudden weakness in arms or legs:     Sudden numbness in arms or legs:     Sudden onset of difficulty speaking or slurred speech:    Temporary loss of vision in one eye:     Problems with dizziness:         Gastrointestinal    Blood in stool:     Vomited blood:         Genitourinary    Burning when urinating:     Blood in urine:        Psychiatric    Major depression:         Hematologic    Bleeding problems:    Problems with blood clotting too easily:        Skin    Rashes or ulcers:        Constitutional    Fever or chills:     PHYSICAL EXAM:   Vitals:   08/04/20 0926 08/04/20 0936  BP: (!) 144/69 132/77  Pulse: (!) 44 (!) 46  Resp: 18   Temp: 97.7 F (36.5 C)   TempSrc: Temporal   SpO2: 100%   Weight: 188 lb (85.3 kg)   Height: 5\' 8"  (1.727 m)     GENERAL: The patient is a well-nourished male, in no acute distress. The vital signs are documented above. CARDIAC: There is a regular rate and rhythm.  VASCULAR: He has a right carotid bruit.   He has palpable femoral and posterior tibial pulses bilaterally. PULMONARY: There is good air exchange bilaterally without wheezing or rales. ABDOMEN: Soft and non-tender with normal pitched bowel sounds.   I do not palpate an abdominal aortic aneurysm. MUSCULOSKELETAL: There are no major deformities or cyanosis. NEUROLOGIC: No focal weakness or paresthesias are detected. SKIN: There are no ulcers or rashes noted. PSYCHIATRIC: The patient has a normal affect.  DATA:    CT HEAD: I reviewed his CT of the head that was done during his admission which was unremarkable.  CAROTID DUPLEX: I reviewed his carotid duplex scan that was done on 06/08/2020.  On the right side he had a less than 39% stenosis in the ICA.  However he had a greater than 50% stenosis in the distal right common carotid artery.  Peak systolic velocity was 421 cm/s with end-diastolic velocity of 145 cm/s.  Thus this would suggest a significant recurrent right carotid stenosis where he had previous carotid endarterectomy.  However, these velocities may be falsely elevated because of the contralateral occlusion.  The right vertebral artery is patent with antegrade flow.  On the left side the ICA was occluded.  The left vertebral artery was patent with antegrade flow.  I reviewed his labs from August when he was in the emergency department.  His GFR was 54.  Hemoglobin 14.5.  Platelets 302,000.

## 2020-08-09 ENCOUNTER — Other Ambulatory Visit: Payer: Self-pay

## 2020-08-09 DIAGNOSIS — I6523 Occlusion and stenosis of bilateral carotid arteries: Secondary | ICD-10-CM

## 2020-09-15 ENCOUNTER — Ambulatory Visit: Payer: Medicare Other | Admitting: Vascular Surgery

## 2020-09-17 ENCOUNTER — Other Ambulatory Visit: Payer: Self-pay | Admitting: *Deleted

## 2020-09-17 DIAGNOSIS — I6523 Occlusion and stenosis of bilateral carotid arteries: Secondary | ICD-10-CM

## 2020-09-21 ENCOUNTER — Other Ambulatory Visit: Payer: Self-pay | Admitting: Cardiology

## 2020-09-21 DIAGNOSIS — I10 Essential (primary) hypertension: Secondary | ICD-10-CM

## 2020-09-21 DIAGNOSIS — I4819 Other persistent atrial fibrillation: Secondary | ICD-10-CM

## 2020-09-21 DIAGNOSIS — E785 Hyperlipidemia, unspecified: Secondary | ICD-10-CM

## 2020-09-21 DIAGNOSIS — Z7901 Long term (current) use of anticoagulants: Secondary | ICD-10-CM

## 2020-09-21 DIAGNOSIS — E119 Type 2 diabetes mellitus without complications: Secondary | ICD-10-CM

## 2020-09-21 DIAGNOSIS — Z789 Other specified health status: Secondary | ICD-10-CM

## 2020-09-21 DIAGNOSIS — I4510 Unspecified right bundle-branch block: Secondary | ICD-10-CM

## 2020-09-21 MED ORDER — APIXABAN 5 MG PO TABS: 5.0000 mg | ORAL_TABLET | Freq: Two times a day (BID) | ORAL | 3 refills | Status: AC

## 2020-10-04 ENCOUNTER — Other Ambulatory Visit: Payer: Medicare Other

## 2020-10-06 ENCOUNTER — Other Ambulatory Visit: Payer: Medicare HMO

## 2020-10-06 ENCOUNTER — Inpatient Hospital Stay: Admission: RE | Admit: 2020-10-06 | Payer: Medicare Other | Source: Ambulatory Visit

## 2020-10-12 ENCOUNTER — Telehealth: Payer: Self-pay | Admitting: Vascular Surgery

## 2020-10-12 NOTE — Telephone Encounter (Signed)
Patient neighbor said he has tested positive for covid and is the hospital with pneumonia. He said he will call us when he is feeling better to reschedule CT and office visit.

## 2020-10-27 ENCOUNTER — Ambulatory Visit: Payer: Medicare Other | Admitting: Vascular Surgery

## 2020-11-09 DEATH — deceased
